# Patient Record
Sex: Male | Born: 2000
Health system: Southern US, Community
[De-identification: ages and names within clinical notes are randomized; demographics above are authoritative.]

## PROBLEM LIST (undated history)

## (undated) HISTORY — PX: WISDOM TOOTH EXTRACTION: SHX21

---
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
## 2019-03-28 ENCOUNTER — Other Ambulatory Visit: Payer: Self-pay

## 2019-03-28 DIAGNOSIS — Z20822 Contact with and (suspected) exposure to covid-19: Secondary | ICD-10-CM

## 2019-03-29 LAB — NOVEL CORONAVIRUS, NAA: SARS-CoV-2, NAA: NOT DETECTED

## 2019-03-29 NOTE — Progress Notes (Signed)
Noted, though he is not a patient of ours.
